# Patient Record
Sex: Male | Born: 1990 | Race: White | Hispanic: No | Marital: Single | Smoking: Former smoker
Health system: Southern US, Community
[De-identification: ages and names within clinical notes are randomized; demographics above are authoritative.]

## PROBLEM LIST (undated history)

## (undated) DIAGNOSIS — J45909 Unspecified asthma, uncomplicated: Secondary | ICD-10-CM

## (undated) HISTORY — PX: NO PAST SURGERIES: SHX2092

---
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
## 2009-11-16 ENCOUNTER — Ambulatory Visit: Payer: Self-pay | Admitting: Family Medicine

## 2010-05-03 ENCOUNTER — Ambulatory Visit: Payer: Self-pay | Admitting: Family Medicine

## 2010-07-21 ENCOUNTER — Ambulatory Visit: Payer: Self-pay | Admitting: Internal Medicine

## 2011-01-21 ENCOUNTER — Ambulatory Visit: Payer: Self-pay | Admitting: Family Medicine

## 2014-12-01 ENCOUNTER — Encounter: Payer: Self-pay | Admitting: Family Medicine

## 2014-12-01 ENCOUNTER — Ambulatory Visit (INDEPENDENT_AMBULATORY_CARE_PROVIDER_SITE_OTHER): Payer: BC Managed Care – PPO | Admitting: Family Medicine

## 2014-12-01 VITALS — BP 120/84 | HR 64 | Ht 68.0 in | Wt 127.0 lb

## 2014-12-01 DIAGNOSIS — W57XXXA Bitten or stung by nonvenomous insect and other nonvenomous arthropods, initial encounter: Secondary | ICD-10-CM

## 2014-12-01 DIAGNOSIS — S70361A Insect bite (nonvenomous), right thigh, initial encounter: Secondary | ICD-10-CM | POA: Diagnosis not present

## 2014-12-01 MED ORDER — DOXYCYCLINE HYCLATE 100 MG PO TABS: 100.0000 mg | ORAL_TABLET | Freq: Two times a day (BID) | ORAL | Status: DC

## 2014-12-01 MED ORDER — MUPIROCIN 2 % EX OINT: 1.0000 "application " | TOPICAL_OINTMENT | Freq: Two times a day (BID) | CUTANEOUS | Status: DC

## 2014-12-01 NOTE — Progress Notes (Signed)
Name: Ronald Ryan   MRN: [id number]    DOB: 1991-02-22   Date:12/01/2014       Progress Note  Subjective  Chief Complaint  Chief Complaint  Patient presents with  . Animal Bite    tick bite- sore and inflamed    Animal Bite  The incident occurred more than 2 days ago. There is an injury to the right thigh. Associated symptoms include headaches. Pertinent negatives include no fussiness, no numbness, no visual disturbance, no nausea, no bladder incontinence, no hearing loss, no focal weakness, no decreased responsiveness, no light-headedness, no loss of consciousness, no seizures, no tingling, no weakness, no cough, no difficulty breathing and no memory loss. He has received no recent medical care.  Rash This is a new problem. The current episode started in the past 7 days. The affected locations include the right upper leg. The rash is characterized by itchiness and redness. He was exposed to nothing. Pertinent negatives include no congestion, cough, eye pain, fever, shortness of breath or sore throat. Past treatments include antihistamine.    No problem-specific assessment & plan notes found for this encounter.   History reviewed. No pertinent past medical history.  History reviewed. No pertinent past surgical history.  Family History  Problem Relation Age of Onset  . Depression Mother     History   Social History  . Marital Status: Single    Spouse Name: N/A  . Number of Children: N/A  . Years of Education: N/A   Occupational History  . Not on file.   Social History Main Topics  . Smoking status: Current Every Day Smoker  . Smokeless tobacco: Not on file  . Alcohol Use: 0.0 oz/week    0 Standard drinks or equivalent per week  . Drug Use: No  . Sexual Activity: Yes   Other Topics Concern  . Not on file   Social History Narrative  . No narrative on file    No Known Allergies   Review of Systems  Constitutional: Negative for fever, chills, weight loss,  malaise/fatigue, diaphoresis and decreased responsiveness.  HENT: Negative for congestion, ear discharge, ear pain, hearing loss, nosebleeds, sore throat and tinnitus.   Eyes: Negative for blurred vision, double vision, photophobia, pain, discharge and visual disturbance.  Respiratory: Negative for cough, hemoptysis, sputum production, shortness of breath, wheezing and stridor.   Cardiovascular: Negative for palpitations and orthopnea.  Gastrointestinal: Negative for nausea.  Genitourinary: Negative for bladder incontinence.  Musculoskeletal: Negative for myalgias and falls.  Skin: Positive for itching and rash.  Neurological: Positive for headaches. Negative for dizziness, tingling, focal weakness, seizures, loss of consciousness, weakness, light-headedness and numbness.  Endo/Heme/Allergies: Does not bruise/bleed easily.  Psychiatric/Behavioral: Negative for memory loss.     Objective  Filed Vitals:   12/01/14 1128  BP: 120/84  Pulse: 64  Height:  (1.727 m)  Weight: 127 lb (57.607 kg)    Physical Exam  Constitutional: He is well-developed, well-nourished, and in no distress.  HENT:  Head: Normocephalic and atraumatic.  Right Ear: External ear normal.  Left Ear: External ear normal.  Nose: Nose normal.  Mouth/Throat: Oropharynx is clear and moist.  Eyes: Conjunctivae and EOM are normal. Pupils are equal, round, and reactive to light. Right eye exhibits no discharge. Left eye exhibits no discharge.  Neck: Normal range of motion. Neck supple. No JVD present. No tracheal deviation present. No thyromegaly present.  Cardiovascular: Normal rate, regular rhythm, normal heart sounds and intact distal pulses.  No murmur heard. Pulmonary/Chest: No respiratory distress. He has no wheezes. He has no rales.  Abdominal: He exhibits no distension. There is no tenderness.  Lymphadenopathy:    He has no cervical adenopathy.  Skin: Rash noted. There is erythema. No pallor.   Psychiatric: Mood and affect normal.      Assessment & Plan  Problem List Items Addressed This Visit    None    Visit Diagnoses    Tick bite of thigh with local reaction, right, initial encounter    -  Primary    Relevant Medications    doxycycline (VIBRA-TABS) 100 MG tablet    mupirocin ointment (BACTROBAN) 2 %         Dr. Hayden Rasmusseneanna Nigil Braman [facility]  12/01/2014

## 2015-01-23 ENCOUNTER — Ambulatory Visit (INDEPENDENT_AMBULATORY_CARE_PROVIDER_SITE_OTHER): Payer: BC Managed Care – PPO | Admitting: Family Medicine

## 2015-01-23 ENCOUNTER — Encounter: Payer: Self-pay | Admitting: Family Medicine

## 2015-01-23 VITALS — BP 124/70 | HR 64 | Temp 98.6°F | Ht 68.0 in | Wt 127.0 lb

## 2015-01-23 DIAGNOSIS — R109 Unspecified abdominal pain: Secondary | ICD-10-CM

## 2015-01-23 DIAGNOSIS — R312 Other microscopic hematuria: Secondary | ICD-10-CM | POA: Diagnosis not present

## 2015-01-23 DIAGNOSIS — R3129 Other microscopic hematuria: Secondary | ICD-10-CM

## 2015-01-23 LAB — POCT URINALYSIS DIPSTICK
Bilirubin, UA: NEGATIVE
Glucose, UA: NEGATIVE
KETONES UA: NEGATIVE
LEUKOCYTES UA: NEGATIVE
NITRITE UA: NEGATIVE
PH UA: 5
Spec Grav, UA: 1.015
Urobilinogen, UA: 0.2

## 2015-01-23 MED ORDER — TRAMADOL HCL 50 MG PO TABS: 50.0000 mg | ORAL_TABLET | Freq: Three times a day (TID) | ORAL | Status: DC | PRN

## 2015-01-23 NOTE — Progress Notes (Signed)
Name: Ronald Ryan   MRN: [id number]    DOB: 1991-01-19   Date:01/23/2015       Progress Note  Subjective  Chief Complaint  Chief Complaint  Patient presents with  . Flank Pain    Flank Pain This is a new problem. The current episode started 1 to 4 weeks ago. The problem occurs intermittently. The problem has been waxing and waning since onset. Pain location: cva area. The quality of the pain is described as aching and stabbing. The pain is moderate. The symptoms are aggravated by bending, standing, twisting and sitting. Pertinent negatives include no abdominal pain, bladder incontinence, bowel incontinence, chest pain, dysuria, fever, headaches, leg pain, tingling or weight loss. He has tried analgesics for the symptoms. The treatment provided no relief.    No problem-specific assessment & plan notes found for this encounter.   No past medical history on file.  No past surgical history on file.  Family History  Problem Relation Age of Onset  . Depression Mother     Social History   Social History  . Marital Status: Single    Spouse Name: N/A  . Number of Children: N/A  . Years of Education: N/A   Occupational History  . Not on file.   Social History Main Topics  . Smoking status: Current Every Day Smoker  . Smokeless tobacco: Not on file  . Alcohol Use: 0.0 oz/week    0 Standard drinks or equivalent per week  . Drug Use: No  . Sexual Activity: Yes   Other Topics Concern  . Not on file   Social History Narrative    No Known Allergies   Review of Systems  Constitutional: Negative for fever, chills, weight loss and malaise/fatigue.  HENT: Negative for ear discharge, ear pain and sore throat.   Eyes: Negative for blurred vision.  Respiratory: Negative for cough, sputum production, shortness of breath and wheezing.   Cardiovascular: Negative for chest pain, palpitations and leg swelling.  Gastrointestinal: Negative for heartburn, nausea, abdominal pain,  diarrhea, constipation, blood in stool, melena and bowel incontinence.  Genitourinary: Positive for flank pain. Negative for bladder incontinence, dysuria, urgency, frequency and hematuria.  Musculoskeletal: Negative for myalgias, back pain, joint pain and neck pain.  Skin: Negative for rash.  Neurological: Negative for dizziness, tingling, sensory change, focal weakness and headaches.  Endo/Heme/Allergies: Negative for environmental allergies and polydipsia. Does not bruise/bleed easily.  Psychiatric/Behavioral: Negative for depression and suicidal ideas. The patient is not nervous/anxious and does not have insomnia.      Objective  Filed Vitals:   01/23/15 1354  BP: 124/70  Pulse: 64  Temp: 98.6 F (37 C)  Height:  (1.727 m)  Weight: 127 lb (57.607 kg)    Physical Exam  Constitutional: He is oriented to person, place, and time and well-developed, well-nourished, and in no distress.  HENT:  Head: Normocephalic.  Right Ear: External ear normal.  Left Ear: External ear normal.  Nose: Nose normal.  Mouth/Throat: Oropharynx is clear and moist.  Eyes: Conjunctivae and EOM are normal. Pupils are equal, round, and reactive to light. Right eye exhibits no discharge. Left eye exhibits no discharge. No scleral icterus.  Neck: Normal range of motion. Neck supple. No JVD present. No tracheal deviation present. No thyromegaly present.  Cardiovascular: Normal rate, regular rhythm, normal heart sounds and intact distal pulses.  Exam reveals no gallop and no friction rub.   No murmur heard. Pulmonary/Chest: Breath sounds normal. No respiratory distress.  He has no wheezes. He has no rales.  Abdominal: Soft. Bowel sounds are normal. He exhibits no mass. There is no hepatosplenomegaly. There is no tenderness. There is no rebound, no guarding and no CVA tenderness.  Musculoskeletal: Normal range of motion. He exhibits no edema or tenderness.  Lymphadenopathy:    He has no cervical adenopathy.   Neurological: He is alert and oriented to person, place, and time. He has normal sensation, normal strength, normal reflexes and intact cranial nerves. No cranial nerve deficit.  Skin: Skin is warm. No rash noted.  Psychiatric: Mood and affect normal.      Assessment & Plan  Problem List Items Addressed This Visit    None    Visit Diagnoses    Hematuria, microscopic    -  Primary    Relevant Orders    POCT Urinalysis Dipstick (Completed)    Right flank pain        Relevant Medications    traMADol (ULTRAM) 50 MG tablet         Dr. Hayden [facility]  01/23/2015

## 2015-01-26 ENCOUNTER — Ambulatory Visit
Admission: RE | Admit: 2015-01-26 | Discharge: 2015-01-26 | Disposition: A | Discharge status: Home / Self Care | Payer: BC Managed Care – PPO | Source: Ambulatory Visit | Attending: Family Medicine | Admitting: Family Medicine

## 2015-01-26 DIAGNOSIS — R109 Unspecified abdominal pain: Secondary | ICD-10-CM | POA: Diagnosis present

## 2015-01-26 DIAGNOSIS — R312 Other microscopic hematuria: Secondary | ICD-10-CM | POA: Diagnosis present

## 2015-01-26 DIAGNOSIS — N200 Calculus of kidney: Secondary | ICD-10-CM | POA: Diagnosis not present

## 2015-01-26 DIAGNOSIS — R3129 Other microscopic hematuria: Secondary | ICD-10-CM

## 2016-06-07 ENCOUNTER — Ambulatory Visit
Admission: EM | Admit: 2016-06-07 | Discharge: 2016-06-07 | Disposition: A | Discharge status: Home / Self Care | Payer: Self-pay | Attending: Emergency Medicine | Admitting: Emergency Medicine

## 2016-06-07 DIAGNOSIS — J40 Bronchitis, not specified as acute or chronic: Secondary | ICD-10-CM

## 2016-06-07 DIAGNOSIS — J45901 Unspecified asthma with (acute) exacerbation: Secondary | ICD-10-CM

## 2016-06-07 DIAGNOSIS — J0100 Acute maxillary sinusitis, unspecified: Secondary | ICD-10-CM

## 2016-06-07 HISTORY — DX: Unspecified asthma, uncomplicated: J45.909

## 2016-06-07 MED ORDER — IPRATROPIUM-ALBUTEROL 0.5-2.5 (3) MG/3ML IN SOLN
3.0000 mL | Freq: Once | RESPIRATORY_TRACT | Status: AC
  Administered 2016-06-07: 3 mL via RESPIRATORY_TRACT

## 2016-06-07 MED ORDER — PREDNISONE 20 MG PO TABS: 40.0000 mg | ORAL_TABLET | Freq: Every day | ORAL | 0 refills | Status: AC

## 2016-06-07 MED ORDER — DOXYCYCLINE HYCLATE 100 MG PO CAPS: 100.0000 mg | ORAL_CAPSULE | Freq: Two times a day (BID) | ORAL | 0 refills | Status: DC

## 2016-06-07 MED ORDER — BENZONATATE 100 MG PO CAPS: 100.0000 mg | ORAL_CAPSULE | Freq: Three times a day (TID) | ORAL | 0 refills | Status: DC | PRN

## 2016-06-07 MED ORDER — ALBUTEROL SULFATE HFA 108 (90 BASE) MCG/ACT IN AERS: 2.0000 | INHALATION_SPRAY | RESPIRATORY_TRACT | 0 refills | Status: DC | PRN

## 2016-06-07 NOTE — ED Provider Notes (Signed)
[facility] ____________________________________________  Time seen: Approximately 8:57 AM  I have reviewed the triage vital signs and the nursing notes.   HISTORY  Chief Complaint Sinusitis  HPI Ronald Ryan is a 26 y.o. male presenting for the complaints of 2 weeks of runny nose, nasal congestion, sinus pressure, postnasal drainage and cough. Patient reports symptoms started to improve and then returned. Patient reports that he does have a history of asthma, and reports that he ran out of his inhaler yesterday. Patient reports he has been having intermittent wheezing, in which his albuterol inhaler was resolving his wheezes. Patient reports some tightness feeling in his chest with wheezing and occasional shortness of breath, consistent with his previous asthma. Reports cough alternates between dry cough and intermittently productive. Reports blowing nose and getting thick yellowish mucus out. Denies fevers. Denies known sick contacts. Reports symptoms have been unresolved over-the-counter cough and congestion medications.  Denies chest pain. Denies current shortness of breath. Denies chest pain with deep breath. Denies fevers, dysuria, extremity swelling, recent surgery or hospitalization or immobilization, rash, sore throat or change in oral intake.  Elizabeth Sauereanna Jones, MD: PCP   Past Medical History:  Diagnosis Date  . Asthma     There are no active problems to display for this patient.   Past Surgical History:  Procedure Laterality Date  . NO PAST SURGERIES      No current facility-administered medications for this encounter.   Current Outpatient Prescriptions:  .  Prn albuterol inhaler.  Allergies Patient has no known allergies.  Family History  Problem Relation Age of Onset  . Depression Mother     Social History Social History  Substance Use Topics  . Smoking status: Former Games developermoker  . Smokeless tobacco: Never Used  . Alcohol use 0.0 oz/week     Review of Systems Constitutional: No fever/chills Eyes: No visual changes. ENT: No sore throat. Cardiovascular: Denies chest pain. Respiratory:As above. Gastrointestinal: No abdominal pain.  No nausea, no vomiting.  No diarrhea.  No constipation. Genitourinary: Negative for dysuria. Musculoskeletal: Negative for back pain. Skin: Negative for rash. Neurological: Negative for headaches, focal weakness or numbness.  10-point ROS otherwise negative.  ____________________________________________   PHYSICAL EXAM:  VITAL SIGNS: ED Triage Vitals  Enc Vitals Group     BP 06/07/16 0846 124/78     Pulse Rate 06/07/16 0846 87     Resp 06/07/16 0846 17     Temp 06/07/16 0846 98.9 F (37.2 C)     Temp Source 06/07/16 0846 Oral     SpO2 06/07/16 0846 95 %     Weight 06/07/16 0844 128 lb (58.1 kg)     Height 06/07/16 0844 5\' 8"  (1.727 m)     Head Circumference --      Peak Flow --      Pain Score 06/07/16 0846 1     Pain Loc --      Pain Edu? --      Excl. in GC? --    Today's Vitals   06/07/16 0844 06/07/16 0846 06/07/16 0932 06/07/16 0935  BP:  124/78 129/77   Pulse:  87 89   Resp:  17    Temp:  98.9 F (37.2 C)    TempSrc:  Oral    SpO2:  95% 99%   Weight: 128 lb (58.1 kg)     Height: 5\' 8"  (1.727 m)     PainSc:  1   1    Constitutional: Alert and oriented.  Well appearing and in no acute distress. Eyes: Conjunctivae are normal. PERRL. EOMI. Head: Atraumatic.Mild tenderness to palpation bilateral maxillary sinuses, no frontal sinus tenderness to palpation. No swelling. No erythema.   Ears: no erythema, normal TMs bilaterally.   Nose: nasal congestion with bilateral nasal turbinate erythema and edema.   Mouth/Throat: Mucous membranes are moist.  Oropharynx non-erythematous.No tonsillar swelling or exudate.  Neck: No stridor.  No cervical spine tenderness to palpation. Hematological/Lymphatic/Immunilogical: No cervical lymphadenopathy. Cardiovascular: Normal rate,  regular rhythm. Grossly normal heart sounds.  Good peripheral circulation. Respiratory: Normal respiratory effort.  No retractions. Mild inspiratory and expiratory wheezes throughout. No rales or rhonchi. Speaks in complete sentences. Good air movement.  Gastrointestinal: Soft and nontender. No distention. No CVA tenderness. Musculoskeletal: No lower extremity edema or tenderness to palpation bilaterally.  Bilateral pedal pulses equal and easily palpated. No cervical, thoracic or lumbar tenderness to palpation.  Neurologic:  Normal speech and language.  No gait instability. Skin:  Skin is warm, dry and intact. No rash noted. Psychiatric: Mood and affect are normal. Speech and behavior are normal. __________________________________________   LABS (all labs ordered are listed, but only abnormal results are displayed)  Labs Reviewed - No data to display  PROCEDURES Procedures    INITIAL IMPRESSION / ASSESSMENT AND PLAN / ED COURSE  Pertinent labs & imaging results that were available during my care of the patient were reviewed by me and considered in my medical decision making (see chart for details).  Well-appearing patient. No acute distress. Suspect sinusitis and bronchitis and asthma exacerbation. DuoNeb given once in urgent care.   After DuoNeb, patient reevaluated, wheezes fully resolved. Patient reports he is feeling much better. Denies any current shortness of breath, chest tightness or wheezing. Patient reports that he is feeling much better and requests to go home. Suspect sinusitis, bronchitis and asthma exacerbation. Will treat patient with oral doxycycline, albuterol inhaler, when necessary Tessalon Perles and prednisone. Encouraged supportive care, rest, fluids.Discussed indication, risks and benefits of medications with patient.  Discussed follow up with Primary care physician this week. Discussed follow up and return parameters including no resolution or any worsening  concerns. Patient verbalized understanding and agreed to plan.   ____________________________________________   FINAL CLINICAL IMPRESSION(S) / ED DIAGNOSES  Final diagnoses:  Acute maxillary sinusitis, recurrence not specified  Bronchitis  Exacerbation of asthma, unspecified asthma severity, unspecified whether persistent     New Prescriptions   ALBUTEROL (PROVENTIL HFA;VENTOLIN HFA) 108 (90 BASE) MCG/ACT INHALER    Inhale 2 puffs into the lungs every 4 (four) hours as needed for wheezing or shortness of breath.   BENZONATATE (TESSALON PERLES) 100 MG CAPSULE    Take 1 capsule (100 mg total) by mouth 3 (three) times daily as needed for cough.   DOXYCYCLINE (VIBRAMYCIN) 100 MG CAPSULE    Take 1 capsule (100 mg total) by mouth 2 (two) times daily.   PREDNISONE (DELTASONE) 20 MG TABLET    Take 2 tablets (40 mg total) by mouth daily.    Note: This dictation was prepared with Dragon dictation along with smaller phrase technology. Any transcriptional errors that result from this process are unintentional.    Clinical Course       Renford Dills, NP 06/07/16 2130    Renford Dills, NP 06/07/16 [phone]

## 2016-06-07 NOTE — Discharge Instructions (Signed)
Take medication as prescribed. Rest. Drink plenty of fluids.  ° °Follow up with your primary care physician this week as needed. Return to Urgent care for new or worsening concerns.  ° °

## 2016-06-07 NOTE — ED Triage Notes (Signed)
Patient complains of sinus pain and pressure, cough and runny nose x 2 weeks. Patient states that he shook this about 1 week ago and symptoms returned.

## 2017-04-04 IMAGING — CT CT RENAL STONE PROTOCOL
2 of 4 series · 17 of 46 positions shown, 19 images · non-contrast
Comparison: None.

CLINICAL DATA: Right flank pain and hematuria for 3 weeks. No acute
injury. Initial encounter.

EXAM:
CT ABDOMEN AND PELVIS WITHOUT CONTRAST
TECHNIQUE: Multidetector CT imaging of the abdomen and pelvis was performed
following the standard protocol without IV contrast.

[Series 2: soft tissue · axial · 0.62mm/px · z∈[-890,-495]mm · 14 of 87 slices shown, 16 images]
[im 4/87  soft-tissue]
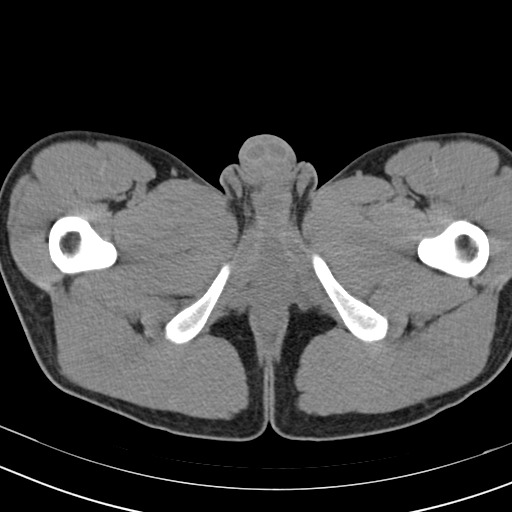
[im 4/87  bone]
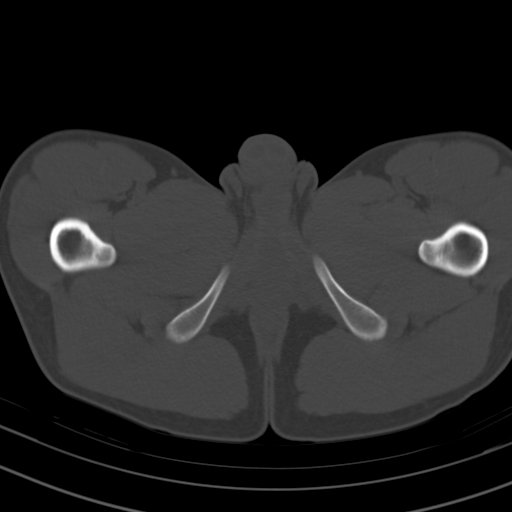
[im 12/87  soft-tissue]
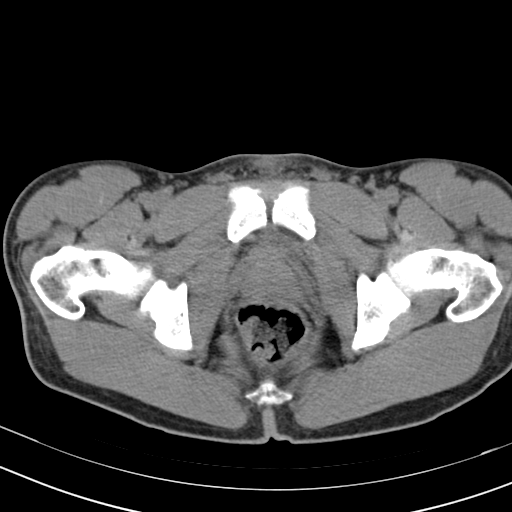
[im 15/87  soft-tissue]
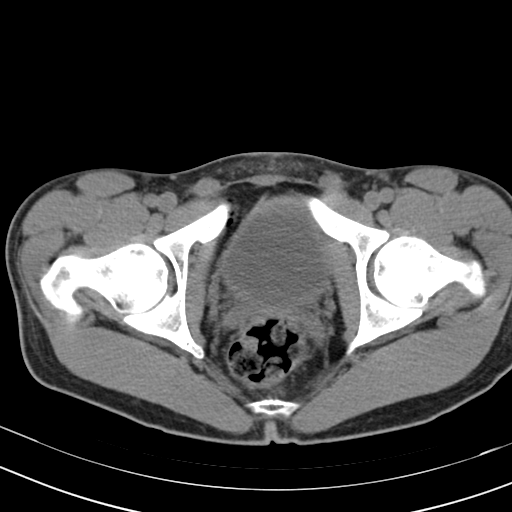
[im 23/87  soft-tissue]
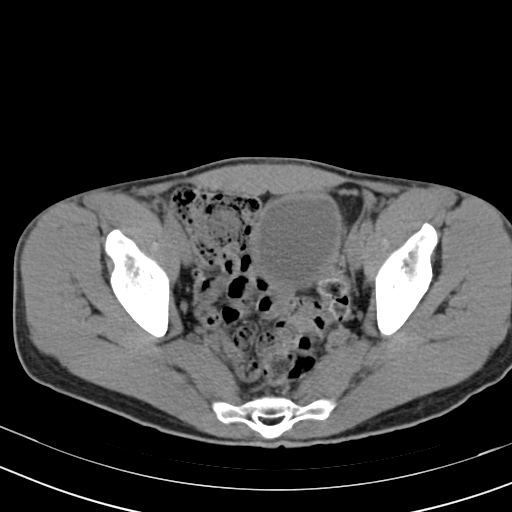
[im 30/87  soft-tissue]
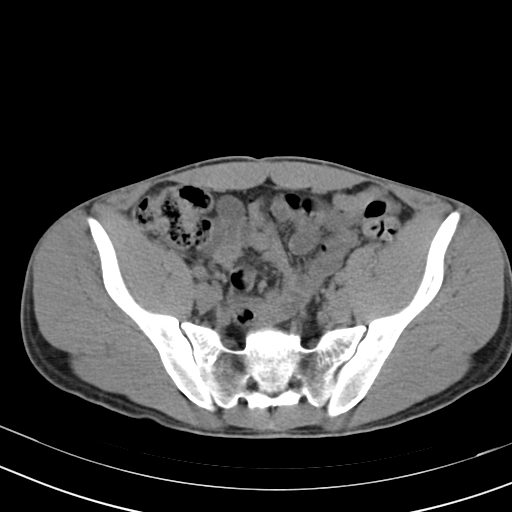
[im 34/87  soft-tissue]
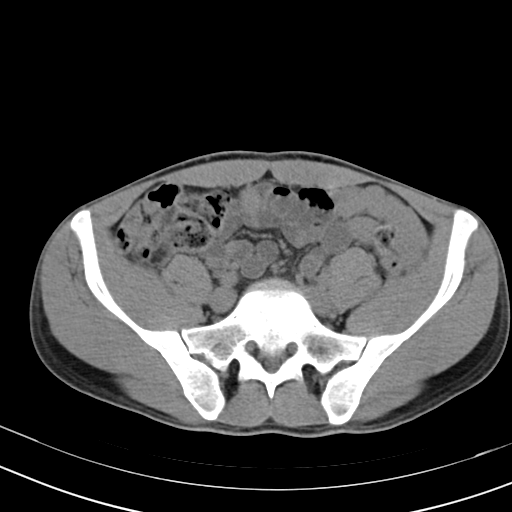
[im 42/87  soft-tissue]
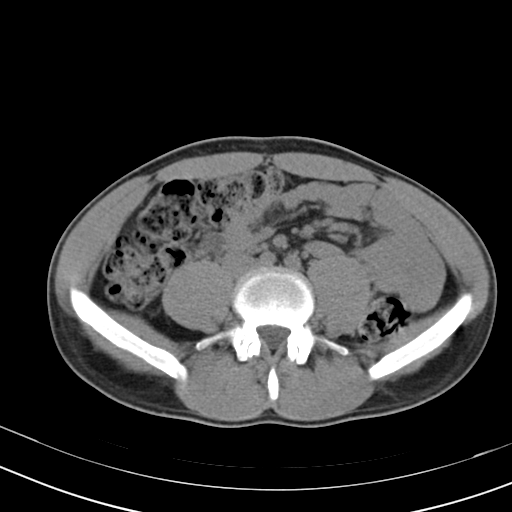
[im 45/87  soft-tissue]
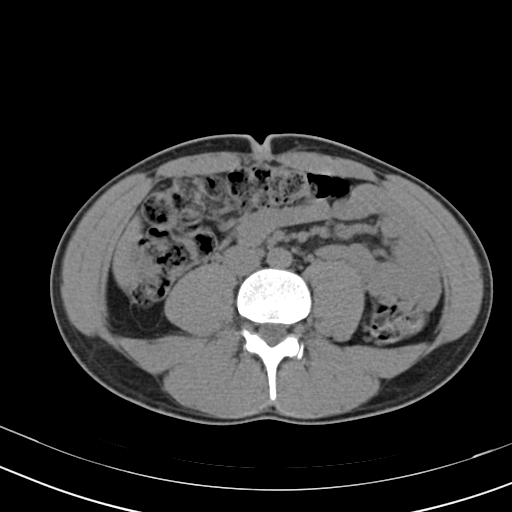
[im 53/87  soft-tissue]
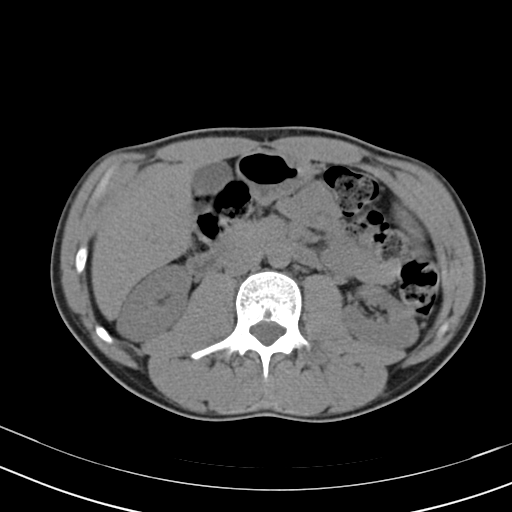
[im 53/87  bone]
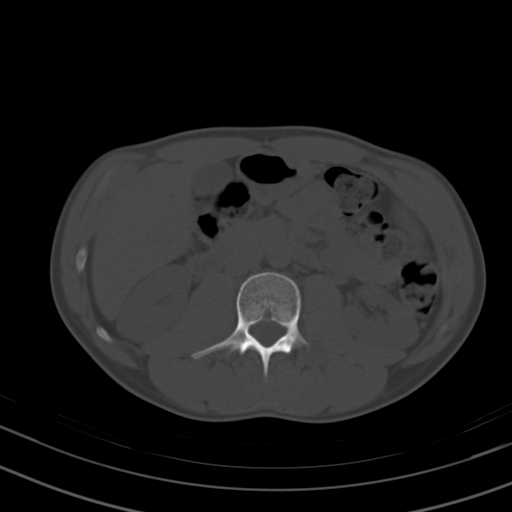
[im 57/87  soft-tissue]
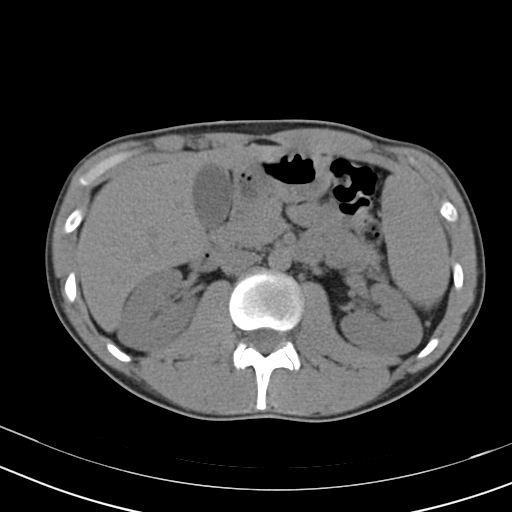
[im 64/87  soft-tissue]
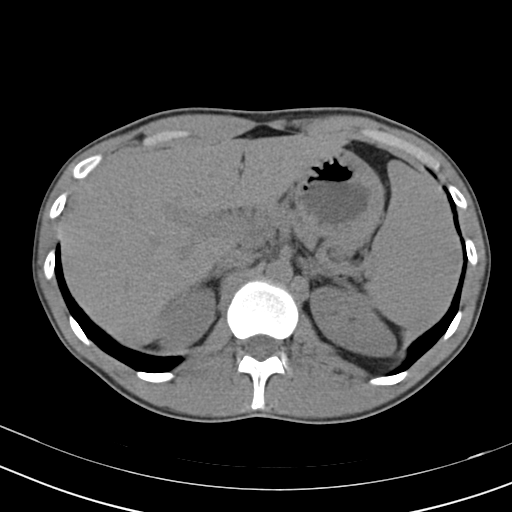
[im 72/87  soft-tissue]
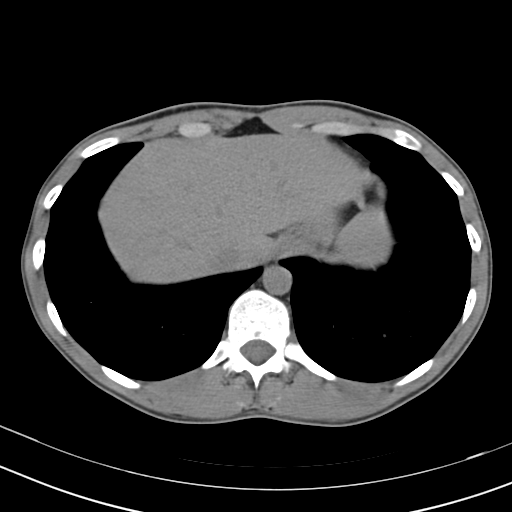
[im 75/87  soft-tissue]
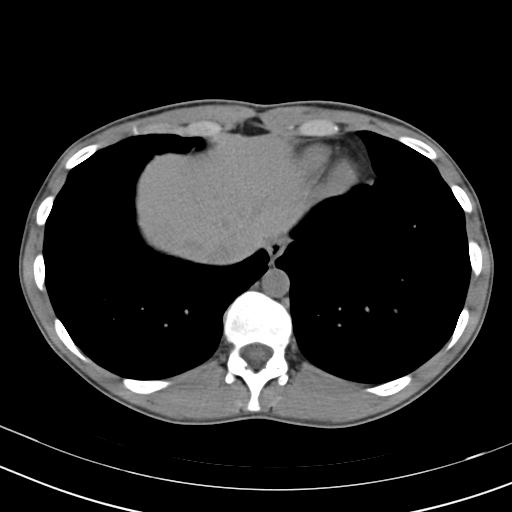
[im 83/87  soft-tissue]
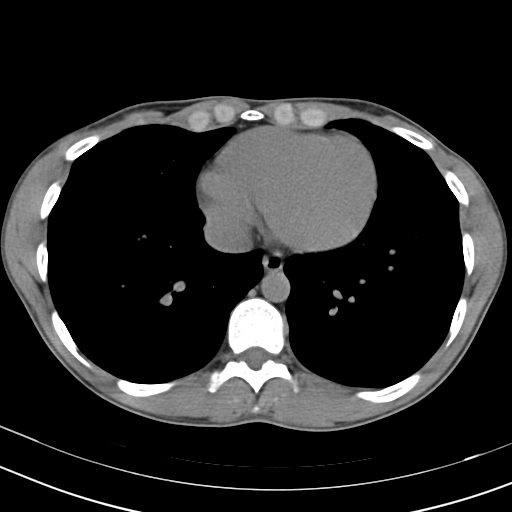

[Series 602: coronal · coronal · 0.84mm/px · 3 of 84 slices shown]
[im 28/84  soft-tissue]
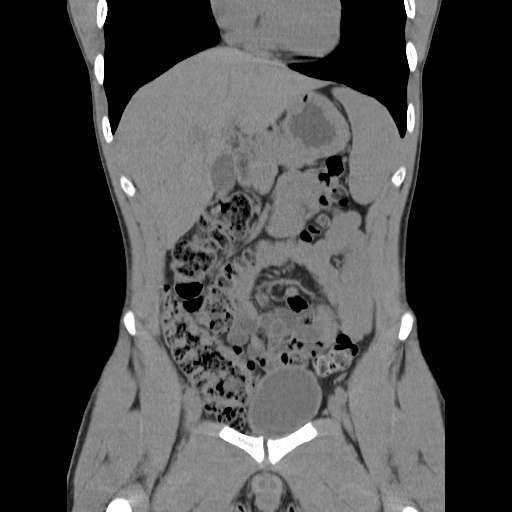
[im 37/84  soft-tissue]
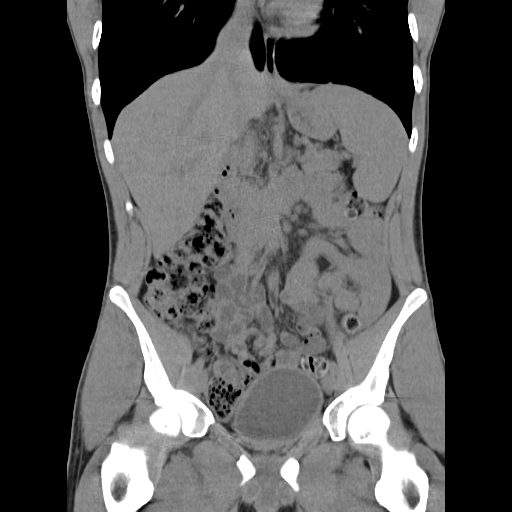
[im 47/84  soft-tissue]
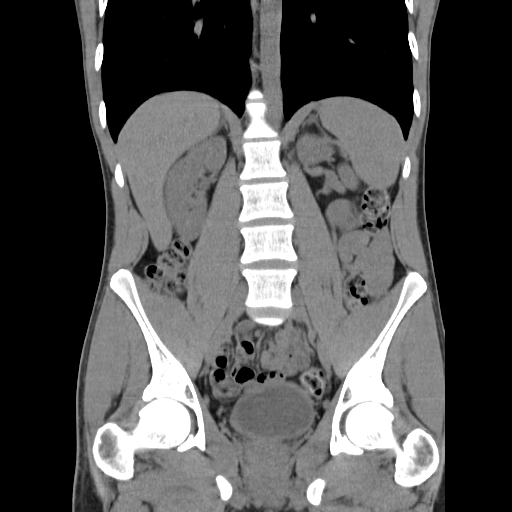

[17 of 46 positions shown; findings below may reference images not displayed]

FINDINGS: Lower chest: 3 mm right lower lobe nodule on image number 9 is of
doubtful significance. The lung bases are otherwise clear. There is
no pleural effusion.

Hepatobiliary: As evaluated in the noncontrast state, the liver
appears unremarkable. No evidence of gallstones, gallbladder wall
thickening or biliary dilatation.

Pancreas: Unremarkable. No pancreatic ductal dilatation or
surrounding inflammatory changes.

Spleen: Normal in size without apparent focal abnormality. Small
accessory splenule posteriorly.

Adrenals/Urinary Tract: Both adrenal glands appear normal. Tiny
calculus in the upper pole of the right kidney is best seen on
coronal image number 54. No other urinary tract calculi identified.
No hydronephrosis or perinephric soft tissue stranding. As evaluated
in the noncontrast state, the kidneys and bladder otherwise appear
unremarkable.

Stomach/Bowel: No evidence of bowel wall thickening, distention or
surrounding inflammatory change. The appendix appears normal.

Vascular/Lymphatic: There are no enlarged abdominal or pelvic lymph
nodes. No significant vascular findings on noncontrast imaging.

Reproductive: Unremarkable.

Other: No evidence of abdominal wall mass or hernia.

Musculoskeletal: No acute or significant osseous findings. Tiny bone
all is noted in the right femoral head and right sacrum.
IMPRESSION: 1. Tiny nonobstructing calculus in the upper pole of the right
kidney. No evidence of ureteral calculus or hydronephrosis.
2. No acute abdominal pelvic findings.

## 2018-02-03 ENCOUNTER — Other Ambulatory Visit: Payer: Self-pay | Admitting: Family Medicine

## 2018-03-30 ENCOUNTER — Ambulatory Visit (INDEPENDENT_AMBULATORY_CARE_PROVIDER_SITE_OTHER): Payer: Self-pay | Admitting: Family Medicine

## 2018-03-30 ENCOUNTER — Encounter: Payer: Self-pay | Admitting: Family Medicine

## 2018-03-30 VITALS — BP 120/76 | HR 76 | Ht 68.0 in | Wt 140.0 lb

## 2018-03-30 DIAGNOSIS — J0100 Acute maxillary sinusitis, unspecified: Secondary | ICD-10-CM

## 2018-03-30 DIAGNOSIS — Z8709 Personal history of other diseases of the respiratory system: Secondary | ICD-10-CM

## 2018-03-30 DIAGNOSIS — Z7689 Persons encountering health services in other specified circumstances: Secondary | ICD-10-CM

## 2018-03-30 DIAGNOSIS — R55 Syncope and collapse: Secondary | ICD-10-CM

## 2018-03-30 LAB — POCT CBG (FASTING - GLUCOSE)-MANUAL ENTRY: GLUCOSE FASTING, POC: 78 mg/dL (ref 70–99)

## 2018-03-30 MED ORDER — ALBUTEROL SULFATE HFA 108 (90 BASE) MCG/ACT IN AERS: 2.0000 | INHALATION_SPRAY | Freq: Four times a day (QID) | RESPIRATORY_TRACT | 2 refills | Status: DC | PRN

## 2018-03-30 MED ORDER — AMOXICILLIN 500 MG PO CAPS: 500.0000 mg | ORAL_CAPSULE | Freq: Three times a day (TID) | ORAL | 0 refills | Status: DC

## 2018-03-30 NOTE — Progress Notes (Signed)
Date:  03/30/2018   Name:  Ronald Ryan   DOB:  [birth year]   MRN:  [id number]   Chief Complaint: Establish Care; Asthma (needs refill on ventolin inhaler); and shaky feeling (experiencing a shaky feeling, when eats he feels better. Diabetes?) Asthma  He complains of cough. There is no chest tightness, difficulty breathing, frequent throat clearing, hemoptysis, hoarse voice, shortness of breath, sputum production or wheezing. This is a recurrent problem. The problem occurs rarely. The problem has been unchanged. Associated symptoms include postnasal drip and rhinorrhea. Pertinent negatives include no chest pain, ear pain, fever, headaches, myalgias or sore throat. His symptoms are aggravated by any activity and change in weather ("runner's asthma"). His symptoms are alleviated by beta-agonist. His past medical history is significant for asthma.  Dizziness  This is a new ("shakey") problem. The current episode started more than 1 year ago. The problem occurs intermittently. The problem has been gradually improving. Associated symptoms include chills, congestion and coughing. Pertinent negatives include no abdominal pain, anorexia, arthralgias, change in bowel habit, chest pain, diaphoresis, fatigue, fever, headaches, joint swelling, myalgias, nausea, neck pain, numbness, rash, sore throat, swollen glands, urinary symptoms, vertigo, visual change, vomiting or weakness. Exacerbated by: skipping meals.  Sinusitis  This is a new problem. The current episode started in the past 7 days. The problem has been gradually worsening since onset. Associated symptoms include chills, congestion, coughing and sinus pressure. Pertinent negatives include no diaphoresis, ear pain, headaches, hoarse voice, neck pain, shortness of breath, sore throat or swollen glands.     Review of Systems  Constitutional: Positive for chills. Negative for diaphoresis, fatigue and fever.  HENT: Positive for congestion, postnasal  drip, rhinorrhea and sinus pressure. Negative for drooling, ear discharge, ear pain, hoarse voice and sore throat.   Respiratory: Positive for cough. Negative for hemoptysis, sputum production, shortness of breath and wheezing.   Cardiovascular: Negative for chest pain, palpitations and leg swelling.  Gastrointestinal: Negative for abdominal pain, anorexia, blood in stool, change in bowel habit, constipation, diarrhea, nausea and vomiting.  Endocrine: Negative for polydipsia.  Genitourinary: Negative for dysuria, frequency, hematuria and urgency.  Musculoskeletal: Negative for arthralgias, back pain, joint swelling, myalgias and neck pain.  Skin: Negative for rash.  Allergic/Immunologic: Negative for environmental allergies.  Neurological: Positive for dizziness. Negative for vertigo, weakness, numbness and headaches.  Hematological: Does not bruise/bleed easily.  Psychiatric/Behavioral: Negative for suicidal ideas. The patient is not nervous/anxious.     There are no active problems to display for this patient.   No Known Allergies  Past Surgical History:  Procedure Laterality Date  . NO PAST SURGERIES      Social History   Tobacco Use  . Smoking status: Former Games developer  . Smokeless tobacco: Never Used  Substance Use Topics  . Alcohol use: Yes    Alcohol/week: 0.0 standard drinks  . Drug use: No     Medication list has been reviewed and updated.  Current Meds  Medication Sig  . [DISCONTINUED] albuterol (PROVENTIL HFA;VENTOLIN HFA) 108 (90 Base) MCG/ACT inhaler Inhale 2 puffs into the lungs every 4 (four) hours as needed for wheezing or shortness of breath.    PHQ 2/9 Scores 03/30/2018  PHQ - 2 Score 0  PHQ- 9 Score 0    Physical Exam  Constitutional: He is oriented to person, place, and time.  HENT:  Head: Normocephalic.  Right Ear: Hearing, tympanic membrane, external ear and ear canal normal.  Left Ear: Hearing, tympanic membrane,  external ear and ear canal  normal.  Nose: Nose normal.  Mouth/Throat: Oropharynx is clear and moist. No oropharyngeal exudate, posterior oropharyngeal edema or posterior oropharyngeal erythema.  Eyes: Pupils are equal, round, and reactive to light. Conjunctivae and EOM are normal. Right eye exhibits no discharge. Left eye exhibits no discharge. No scleral icterus.  Neck: Normal range of motion. Neck supple. No JVD present. No tracheal deviation present. No thyromegaly present.  Cardiovascular: Normal rate, regular rhythm, normal heart sounds and intact distal pulses. Exam reveals no gallop and no friction rub.  No murmur heard. Pulmonary/Chest: Breath sounds normal. No respiratory distress. He has no wheezes. He has no rales.  Abdominal: Soft. Bowel sounds are normal. He exhibits no mass. There is no hepatosplenomegaly. There is no tenderness. There is no rebound, no guarding and no CVA tenderness.  Musculoskeletal: Normal range of motion. He exhibits no edema or tenderness.  Lymphadenopathy:    He has no cervical adenopathy.  Neurological: He is alert and oriented to person, place, and time. He has normal strength and normal reflexes. No cranial nerve deficit.  Skin: Skin is warm. No rash noted.  Nursing note and vitals reviewed.   BP 120/76   Pulse 76   Ht 5\' 8"  (1.727 m)   Wt 140 lb (63.5 kg)   BMI 21.29 kg/m   Assessment and Plan:  1. Establishing care with new doctor, encounter for Patient to reestablish care   2. Near syncope New onset Patient has noted increased unsteadiness and attributes to infrequent eating. Desires check on diabetes - Hemoglobin A1c - Renal Function Panel - POCT CBG (Fasting - Glucose)  3. Hx of extrinsic asthma Recurrent "runner's asthma" Refill albuterol inhaler. - albuterol (PROVENTIL HFA;VENTOLIN HFA) 108 (90 Base) MCG/ACT inhaler; Inhale 2 puffs into the lungs every 6 (six) hours as needed for wheezing or shortness of breath.  Dispense: 1 Inhaler; Refill: 2  4. Acute  maxillary sinusitis, recurrence not specified Acute. Precribe amoxil 500 mg tid. - amoxicillin (AMOXIL) 500 MG capsule; Take 1 capsule (500 mg total) by mouth 3 (three) times daily.  Dispense: 30 capsule; Refill: 0   Dr. [facility]  03/30/2018

## 2018-08-23 ENCOUNTER — Ambulatory Visit (INDEPENDENT_AMBULATORY_CARE_PROVIDER_SITE_OTHER): Payer: Self-pay | Admitting: Family Medicine

## 2018-08-23 ENCOUNTER — Other Ambulatory Visit: Payer: Self-pay

## 2018-08-23 ENCOUNTER — Encounter: Payer: Self-pay | Admitting: Family Medicine

## 2018-08-23 VITALS — BP 120/80 | HR 80 | Ht 68.0 in | Wt 138.0 lb

## 2018-08-23 DIAGNOSIS — T07XXXA Unspecified multiple injuries, initial encounter: Secondary | ICD-10-CM

## 2018-08-23 DIAGNOSIS — W5501XA Bitten by cat, initial encounter: Secondary | ICD-10-CM

## 2018-08-23 DIAGNOSIS — Z23 Encounter for immunization: Secondary | ICD-10-CM

## 2018-08-23 DIAGNOSIS — S61051A Open bite of right thumb without damage to nail, initial encounter: Secondary | ICD-10-CM

## 2018-08-23 MED ORDER — AMOXICILLIN-POT CLAVULANATE 875-125 MG PO TABS: 1.0000 | ORAL_TABLET | Freq: Two times a day (BID) | ORAL | 0 refills | Status: AC

## 2018-08-23 MED ORDER — MUPIROCIN 2 % EX OINT: 1.0000 "application " | TOPICAL_OINTMENT | Freq: Two times a day (BID) | CUTANEOUS | 0 refills | Status: AC

## 2018-08-23 NOTE — Progress Notes (Signed)
Date:  08/23/2018   Name:  Ronald Ryan   DOB:  09/07/1990   MRN:  [id number]   Chief Complaint: Animal Bite (cat bit him on R) thumb/ hand- had 5 pills of Amoxil left from Oct. has taken that- hand still hurts)  Animal Bite   The incident occurred more than 2 days ago. The incident occurred at home. There is an injury to the right wrist, right hand and right forearm. There is an injury to the right thumb. The pain is mild. It is unlikely that a foreign body is present. Pertinent negatives include no chest pain, no abdominal pain, no nausea, no headaches, no neck pain and no cough. There have been no prior injuries to these areas. Ronald Ryan is right-handed. His tetanus status is UTD. There were no sick contacts.    Review of Systems  Constitutional: Negative for chills and fever.  HENT: Negative for drooling, ear discharge, ear pain and sore throat.   Respiratory: Negative for cough, shortness of breath and wheezing.   Cardiovascular: Negative for chest pain, palpitations and leg swelling.  Gastrointestinal: Negative for abdominal pain, blood in stool, constipation, diarrhea and nausea.  Endocrine: Negative for polydipsia.  Genitourinary: Negative for dysuria, frequency, hematuria and urgency.  Musculoskeletal: Negative for back pain, myalgias and neck pain.  Skin: Negative for rash.  Allergic/Immunologic: Negative for environmental allergies.  Neurological: Negative for dizziness and headaches.  Hematological: Does not bruise/bleed easily.  Psychiatric/Behavioral: Negative for suicidal ideas. The patient is not nervous/anxious.     There are no active problems to display for this patient.   No Known Allergies  Past Surgical History:  Procedure Laterality Date  . NO PAST SURGERIES      Social History   Tobacco Use  . Smoking status: Former Games developer  . Smokeless tobacco: Never Used  Substance Use Topics  . Alcohol use: Yes    Alcohol/week: 0.0 standard drinks  . Drug use: No     Medication list has been reviewed and updated.  Current Meds  Medication Sig  . albuterol (PROVENTIL HFA;VENTOLIN HFA) 108 (90 Base) MCG/ACT inhaler Inhale 2 puffs into the lungs every 6 (six) hours as needed for wheezing or shortness of breath.    PHQ 2/9 Scores 03/30/2018  PHQ - 2 Score 0  PHQ- 9 Score 0    Physical Exam Vitals signs and nursing note reviewed.  HENT:     Head: Normocephalic.     Right Ear: External ear normal.     Left Ear: External ear normal.     Nose: Nose normal.  Eyes:     General: No scleral icterus.       Right eye: No discharge.        Left eye: No discharge.     Conjunctiva/sclera: Conjunctivae normal.     Pupils: Pupils are equal, round, and reactive to light.  Neck:     Musculoskeletal: Normal range of motion and neck supple.     Thyroid: No thyromegaly.     Vascular: No JVD.     Trachea: No tracheal deviation.  Cardiovascular:     Rate and Rhythm: Normal rate and regular rhythm.     Heart sounds: Normal heart sounds. No murmur. No friction rub. No gallop.   Pulmonary:     Effort: No respiratory distress.     Breath sounds: Normal breath sounds. No wheezing or rales.  Abdominal:     General: Bowel sounds are normal.  Palpations: Abdomen is soft. There is no mass.     Tenderness: There is no abdominal tenderness. There is no guarding or rebound.  Musculoskeletal: Normal range of motion.        General: No tenderness.       Hands:     Comments: Two puncture wounds of right thumb/ multiple scratches  Lymphadenopathy:     Cervical: No cervical adenopathy.  Skin:    General: Skin is warm.     Findings: Wound present. No rash.     Comments: 2 puncture wounds of right thumb  Neurological:     Mental Status: Ronald Ryan is alert and oriented to person, place, and time.     Cranial Nerves: No cranial nerve deficit.     Deep Tendon Reflexes: Reflexes are normal and symmetric.     Wt Readings from Last 3 Encounters:  08/23/18 138 lb (62.6  kg)  03/30/18 140 lb (63.5 kg)  06/07/16 128 lb (58.1 kg)    BP 120/80   Pulse 80   Ht 5\' 8"  (1.727 m)   Wt 138 lb (62.6 kg)   BMI 20.98 kg/m   Assessment and Plan: 1. Multiple puncture wounds Patient has 2 puncture wounds of the right by the cats canine.  Patient has been taking regular oxacillin per 2-1/2 days with improvement but not total resolution.  Normal erythema and tenderness.  Will continue Augmentin for 5 more days as well as use Bactroban. - amoxicillin-clavulanate (AUGMENTIN) 875-125 MG tablet; Take 1 tablet by mouth 2 (two) times daily.  Dispense: 20 tablet; Refill: 0 - mupirocin ointment (BACTROBAN) 2 %; Apply 1 application topically 2 (two) times daily.  Dispense: 22 g; Refill: 0 - Tdap vaccine greater than or equal to 7yo IM  2. Cat bite of right thumb, initial encounter Patient had a cat that was somewhat provoked because Ronald Ryan did not want to come in.  Initiated with Augmentin and Bactroban.  Discussed - Tdap vaccine greater than or equal to 7yo IM  3. Need for diphtheria-tetanus-pertussis (Tdap) vaccine  Discuss and administered. - Tdap vaccine greater than or equal to 7yo IM

## 2019-01-31 ENCOUNTER — Other Ambulatory Visit: Payer: Self-pay

## 2019-01-31 DIAGNOSIS — Z20822 Contact with and (suspected) exposure to covid-19: Secondary | ICD-10-CM

## 2019-02-02 LAB — NOVEL CORONAVIRUS, NAA: SARS-CoV-2, NAA: NOT DETECTED

## 2019-05-31 ENCOUNTER — Other Ambulatory Visit: Payer: Self-pay | Admitting: Family Medicine

## 2019-05-31 DIAGNOSIS — Z8709 Personal history of other diseases of the respiratory system: Secondary | ICD-10-CM

## 2019-06-24 ENCOUNTER — Other Ambulatory Visit: Payer: Self-pay

## 2019-06-27 ENCOUNTER — Ambulatory Visit: Payer: Self-pay | Attending: Internal Medicine

## 2019-06-27 DIAGNOSIS — Z20822 Contact with and (suspected) exposure to covid-19: Secondary | ICD-10-CM | POA: Insufficient documentation

## 2019-06-28 LAB — NOVEL CORONAVIRUS, NAA: SARS-CoV-2, NAA: NOT DETECTED

## 2020-04-16 ENCOUNTER — Telehealth: Payer: Self-pay

## 2020-04-16 NOTE — Telephone Encounter (Signed)
Scheduled appt for tomorrow °

## 2020-04-16 NOTE — Telephone Encounter (Signed)
Copied from CRM [id number]. Topic: General - Inquiry >> Apr 16, 2020  3:19 PM Adrian Prince D wrote: Reason for CRM: Patient would like a call back from Dr. Yetta Barre. He can be reached at [phone]. Please advise

## 2020-04-17 ENCOUNTER — Ambulatory Visit: Payer: Self-pay | Admitting: Family Medicine

## 2020-10-20 ENCOUNTER — Emergency Department
Admission: EM | Admit: 2020-10-20 | Discharge: 2020-10-20 | Disposition: A | Discharge status: Home / Self Care | Payer: Self-pay | Attending: Emergency Medicine | Admitting: Emergency Medicine

## 2020-10-20 ENCOUNTER — Other Ambulatory Visit: Payer: Self-pay

## 2020-10-20 ENCOUNTER — Encounter: Payer: Self-pay | Admitting: Emergency Medicine

## 2020-10-20 DIAGNOSIS — R6883 Chills (without fever): Secondary | ICD-10-CM

## 2020-10-20 DIAGNOSIS — Z87891 Personal history of nicotine dependence: Secondary | ICD-10-CM | POA: Insufficient documentation

## 2020-10-20 DIAGNOSIS — J111 Influenza due to unidentified influenza virus with other respiratory manifestations: Secondary | ICD-10-CM

## 2020-10-20 DIAGNOSIS — R509 Fever, unspecified: Secondary | ICD-10-CM | POA: Insufficient documentation

## 2020-10-20 DIAGNOSIS — J45909 Unspecified asthma, uncomplicated: Secondary | ICD-10-CM | POA: Insufficient documentation

## 2020-10-20 DIAGNOSIS — R519 Headache, unspecified: Secondary | ICD-10-CM | POA: Insufficient documentation

## 2020-10-20 LAB — CBC
HCT: 41.6 % (ref 39.0–52.0)
Hemoglobin: 15 g/dL (ref 13.0–17.0)
MCH: 31.6 pg (ref 26.0–34.0)
MCHC: 36.1 g/dL — ABNORMAL HIGH (ref 30.0–36.0)
MCV: 87.8 fL (ref 80.0–100.0)
Platelets: 135 10*3/uL — ABNORMAL LOW (ref 150–400)
RBC: 4.74 MIL/uL (ref 4.22–5.81)
RDW: 12.2 % (ref 11.5–15.5)
WBC: 4.8 10*3/uL (ref 4.0–10.5)
nRBC: 0 % (ref 0.0–0.2)

## 2020-10-20 LAB — BASIC METABOLIC PANEL
Anion gap: 9 (ref 5–15)
BUN: 13 mg/dL (ref 6–20)
CO2: 24 mmol/L (ref 22–32)
Calcium: 9.1 mg/dL (ref 8.9–10.3)
Chloride: 104 mmol/L (ref 98–111)
Creatinine, Ser: 0.86 mg/dL (ref 0.61–1.24)
GFR, Estimated: >60 mL/min (ref >60)
Glucose, Bld: 99 mg/dL (ref 70–99)
Potassium: 3.6 mmol/L (ref 3.5–5.1)
Sodium: 137 mmol/L (ref 135–145)

## 2020-10-20 LAB — TROPONIN I (HIGH SENSITIVITY): Troponin I (High Sensitivity): 3 ng/L (ref <18)

## 2020-10-20 LAB — TSH: TSH: 1.959 u[IU]/mL (ref 0.350–4.500)

## 2020-10-20 MED ORDER — SODIUM CHLORIDE 0.9 % IV BOLUS
1000.0000 mL | Freq: Once | INTRAVENOUS | Status: AC
  Administered 2020-10-20: 1000 mL via INTRAVENOUS

## 2020-10-20 MED ORDER — KETOROLAC TROMETHAMINE 30 MG/ML IJ SOLN
15.0000 mg | INTRAMUSCULAR | Status: AC
Start: 2020-10-20 — End: 2020-10-20
  Administered 2020-10-20: 15 mg via INTRAVENOUS
  Filled 2020-10-20: qty 1

## 2020-10-20 MED ORDER — ONDANSETRON 4 MG PO TBDP: 4.0000 mg | ORAL_TABLET | Freq: Three times a day (TID) | ORAL | 0 refills | Status: AC | PRN

## 2020-10-20 MED ORDER — NAPROXEN 500 MG PO TABS: 500.0000 mg | ORAL_TABLET | Freq: Two times a day (BID) | ORAL | 0 refills | Status: AC

## 2020-10-20 NOTE — ED Triage Notes (Signed)
Pt via POV from home. Pt c/o chills, fever, and headache since last night. Pt states he had a fever of 102.5 at home and took 1g of Tylenol PTA. Pt is A&Ox4 and NAD. Pt extremely anxious on arrival.

## 2020-10-20 NOTE — ED Notes (Signed)
Pt tolerating PO fluids

## 2020-10-20 NOTE — Discharge Instructions (Signed)
Your lab tests are all normal.  Continue taking anti-inflammatory medicine to control fever and focus on staying well-hydrated.  You can take ondansetron as needed for nausea to make it easier to eat and drink.

## 2020-10-20 NOTE — ED Notes (Signed)
Pt c/o chills, headache, fever of 102.5, nausea. States nausea and headache were present yesterday, absent today.

## 2020-10-20 NOTE — ED Provider Notes (Signed)
[facility] Emergency Department Provider Note  ____________________________________________  Time seen: Approximately 8:15 AM  I have reviewed the triage vital signs and the nursing notes.   HISTORY  Chief Complaint Chills and Headache    HPI Ronald Ryan is a 30 y.o. male with a history of asthma who comes ED complaining of chills fever and generalized headache that started last night.  T-max at home 102.5.  Took 1000 mg of Tylenol just before arriving to the ED.  Denies any focal pain other than headache.  No chest pain, no shortness of breath.  No recent travel trauma hospitalization or surgery, no history of DVT or PE.  No skin rash, no abdominal pain vomiting or diarrhea.  Does feel that he has been drinking and insufficient amount of water last several days.      Past Medical History:  Diagnosis Date  . Asthma      There are no problems to display for this patient.    Past Surgical History:  Procedure Laterality Date  . NO PAST SURGERIES       Prior to Admission medications   Medication Sig Start Date End Date Taking? Authorizing Provider  naproxen (NAPROSYN) 500 MG tablet Take 1 tablet (500 mg total) by mouth 2 (two) times daily with a meal. 10/20/20  Yes Sharman Cheek, MD  ondansetron (ZOFRAN ODT) 4 MG disintegrating tablet Take 1 tablet (4 mg total) by mouth every 8 (eight) hours as needed for nausea or vomiting. 10/20/20  Yes Sharman Cheek, MD  albuterol (VENTOLIN HFA) 108 (90 Base) MCG/ACT inhaler TAKE 2 PUFFS BY MOUTH EVERY 6 HOURS AS NEEDED FOR WHEEZE OR SHORTNESS OF BREATH 05/31/19   Duanne Limerick, MD  amoxicillin-clavulanate (AUGMENTIN) 875-125 MG tablet Take 1 tablet by mouth 2 (two) times daily. 08/23/18   Duanne Limerick, MD  mupirocin ointment (BACTROBAN) 2 % Apply 1 application topically 2 (two) times daily. 08/23/18   Duanne Limerick, MD     Allergies Patient has no known allergies.   Family History  Problem  Relation Age of Onset  . Depression Mother     Social History Social History   Tobacco Use  . Smoking status: Former Games developer  . Smokeless tobacco: Never Used  Substance Use Topics  . Alcohol use: Yes    Alcohol/week: 0.0 standard drinks  . Drug use: No    Review of Systems  Constitutional:   Positive fever and chills.  ENT:   No sore throat. No rhinorrhea. Cardiovascular:   No chest pain or syncope. Respiratory:   No dyspnea or cough. Gastrointestinal:   Negative for abdominal pain, vomiting and diarrhea.  Musculoskeletal:   Negative for focal pain or swelling All other systems reviewed and are negative except as documented above in ROS and HPI.  ____________________________________________   PHYSICAL EXAM:  VITAL SIGNS: ED Triage Vitals  Enc Vitals Group     BP 10/20/20 0720 123/78     Pulse Rate 10/20/20 0729 (!) 141     Resp 10/20/20 0720 20     Temp 10/20/20 0720 (!) 102 F (38.9 C)     Temp Source 10/20/20 0720 Oral     SpO2 10/20/20 0720 97 %     Weight 10/20/20 0720 140 lb (63.5 kg)     Height 10/20/20 0720 5\' 9"  (1.753 m)     Head Circumference --      Peak Flow --      Pain Score 10/20/20 0720  0     Pain Loc --      Pain Edu? --      Excl. in GC? --     Vital signs reviewed, nursing assessments reviewed.   Constitutional:   Alert and oriented. Non-toxic appearance. Eyes:   Conjunctivae are normal. EOMI. PERRL. ENT      Head:   Normocephalic and atraumatic.      Nose: Normal.      Mouth/Throat: Moist mucosa.  Diffuse oropharyngeal erythema.  No tonsillar swelling or asymmetry, no exudates...      Neck:   No meningismus. Full ROM. Hematological/Lymphatic/Immunilogical:   No cervical lymphadenopathy. Cardiovascular:   Tachycardia heart rate 110. Symmetric bilateral radial and DP pulses.  No murmurs. Cap refill less than 2 seconds. Respiratory:   Normal respiratory effort without tachypnea/retractions. Breath sounds are clear and equal bilaterally.  No wheezes/rales/rhonchi. Gastrointestinal:   Soft and nontender. Non distended. There is no CVA tenderness.  No tenderness at McBurney's point.  Negative Murphy sign.  No rebound, rigidity, or guarding. Genitourinary:   deferred Musculoskeletal:   Normal range of motion in all extremities. No joint effusions.  No lower extremity tenderness.  No edema. Neurologic:   Normal speech and language.  Motor grossly intact. No acute focal neurologic deficits are appreciated.  Skin:    Skin is warm, dry and intact. No rash noted.  No petechiae, purpura, or bullae.  There are multiple small abrasions from recent cleaning work that the patient has been doing at home, no inflammatory changes or signs of soft tissue infection.  ____________________________________________    LABS (pertinent positives/negatives) (all labs ordered are listed, but only abnormal results are displayed) Labs Reviewed  CBC - Abnormal; Notable for the following components:      Result Value   MCHC 36.1 (*)    Platelets 135 (*)    All other components within normal limits  BASIC METABOLIC PANEL  TSH  TROPONIN I (HIGH SENSITIVITY)   ____________________________________________   EKG  Interpreted by me Sinus tachycardia rate 138.  Normal axis, normal intervals.  Large amplitude QRS.  Normal ST segments.  T wave inversions in inferior and lateral leads.  ____________________________________________    RADIOLOGY  No results found.  ____________________________________________   PROCEDURES Procedures  ____________________________________________  DIFFERENTIAL DIAGNOSIS   Viral illness, hyperthyroidism, myocarditis, dehydration, electrolyte abnormality  CLINICAL IMPRESSION / ASSESSMENT AND PLAN / ED COURSE  Medications ordered in the ED: Medications  sodium chloride 0.9 % bolus 1,000 mL (1,000 mLs Intravenous New Bag/Given 10/20/20 0748)  ketorolac (TORADOL) 30 MG/ML injection 15 mg (15 mg Intravenous  Given 10/20/20 0848)    Pertinent labs & imaging results that were available during my care of the patient were reviewed by me and considered in my medical decision making (see chart for details).  Ronald Ryan was evaluated in Emergency Department on 10/20/2020 for the symptoms described in the history of present illness. He was evaluated in the context of the global COVID-19 pandemic, which necessitated consideration that the patient might be at risk for infection with the SARS-CoV-2 virus that causes COVID-19. Institutional protocols and algorithms that pertain to the evaluation of patients at risk for COVID-19 are in a state of rapid change based on information released by regulatory bodies including the CDC and federal and state organizations. These policies and algorithms were followed during the patient's care in the ED.   Patient was in usual state of health, until this morning when he woke up with  fever and headache.  He is nontoxic and well-appearing, exam nonfocal.  Most consistent with an influenza-like illness.  Patient declines COVID and flu testing.  Will check labs, give IV fluids and anti-inflammatory medication.  He looks very well, stable for discharge home and outpatient follow-up to which patient agrees.  Doubt ACS, PE, pericarditis, pneumothorax, pneumonia, UTI, intra-abdominal infection, cellulitis abscess necrotizing fasciitis or osteomyelitis.   ----------------------------------------- 9:38 AM on 10/20/2020 -----------------------------------------  Labs normal.  Vital proved, stable for discharge     ____________________________________________   FINAL CLINICAL IMPRESSION(S) / ED DIAGNOSES    Final diagnoses:  Chills  Influenza-like illness     ED Discharge Orders         Ordered    naproxen (NAPROSYN) 500 MG tablet  2 times daily with meals        10/20/20 0815    ondansetron (ZOFRAN ODT) 4 MG disintegrating tablet  Every 8 hours PRN        10/20/20  0815          Portions of this note were generated with dragon dictation software. Dictation errors may occur despite best attempts at proofreading.   Sharman Cheek, MD 10/20/20 [phone]

## 2021-10-07 ENCOUNTER — Ambulatory Visit: Payer: Self-pay

## 2021-10-07 NOTE — Telephone Encounter (Signed)
?  Chief Complaint: Fall from 6 feet ?Symptoms: pain in right leg and wrist . ?Frequency: about 30 minutes ago ?Pertinent Negatives: Patient denies loc -  ?Disposition: [x] ED /[] Urgent Care (no appt availability in office) / [] Appointment(In office/virtual)/ []  [place] [facility]/ [] Home Care/ [] Refused Recommended Disposition /[] [place] [facility]/ []  Follow-up with PCP ?Additional Notes: Pt fell off of a ladder a little over 30 minutes ago. PT fell onto right leg  - foot down and then onto his wrist. Pt states that he is in pain - but can walk. Pt refuses ED and wishes to see Dr. . First appt is 5/11. PT requested transfer to office. Warm transfer to [facility]. ? ?Reason for Disposition ? Injury (or injuries) that need emergency care ? ?Answer Assessment - Initial Assessment Questions ?1. MECHANISM: "How did the fall happen?" ?    off of ladder ?2. DOMESTIC VIOLENCE AND ELDER ABUSE SCREENING: "Did you fall because someone pushed you or tried to hurt you?" If Yes, ask: "Are you safe now?" ?    na ?3. ONSET: "When did the fall happen?" (e.g., minutes, hours, or days ago) ?    30 minutes ago ?4. LOCATION: "What part of the body hit the ground?" (e.g., back, buttocks, head, hips, knees, hands, head, stomach) ?    Leg and hand. ?5. INJURY: "Did you hurt (injure) yourself when you fell?" If Yes, ask: "What did you injure? Tell me more about this?" (e.g., body area; type of injury; pain severity)" ?    Foot hit the ground first, then hand ?6. PAIN: "Is there any pain?" If Yes, ask: "How bad is the pain?" (e.g., Scale 1-10; or mild,  ?moderate, severe) ?  - NONE (0): No pain ?  - MILD (1-3): Doesn't interfere with normal activities  ?  - MODERATE (4-7): Interferes with normal activities or awakens from sleep  ?  - SEVERE (8-10): Excruciating pain, unable to do any normal activities  ?    moderate ?7. SIZE: For cuts, bruises, or swelling, ask: "How large is it?" (e.g., inches or centimeters)  ?     none ?8. PREGNANCY: "Is there any chance you are pregnant?" "When was your last menstrual period?" ?    na ?9. OTHER SYMPTOMS: "Do you have any other symptoms?" (e.g., dizziness, fever, weakness; new onset or worsening).  ?    No - pain ?10. CAUSE: "What do you think caused the fall (or falling)?" (e.g., tripped, dizzy spell) ?      na ? ?Protocols used: Falls and Falling-A-AH ? ?

## 2022-02-17 ENCOUNTER — Ambulatory Visit: Payer: Self-pay | Admitting: Family Medicine
